# Patient Record
Sex: Male | Born: 1995 | Race: White | Hispanic: No | Marital: Single | Smoking: Never smoker
Health system: Southern US, Community
[De-identification: ages and names within clinical notes are randomized; demographics above are authoritative.]

## PROBLEM LIST (undated history)

## (undated) HISTORY — PX: APPENDECTOMY: SHX54

---
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
## 2019-05-08 ENCOUNTER — Other Ambulatory Visit: Payer: Self-pay

## 2019-05-08 ENCOUNTER — Encounter (HOSPITAL_COMMUNITY): Payer: Self-pay | Admitting: *Deleted

## 2019-05-08 ENCOUNTER — Emergency Department (HOSPITAL_COMMUNITY)
Admission: EM | Admit: 2019-05-08 | Discharge: 2019-05-08 | Disposition: A | Discharge status: Home / Self Care | Payer: Commercial Managed Care - PPO | Attending: Emergency Medicine | Admitting: Emergency Medicine

## 2019-05-08 ENCOUNTER — Emergency Department (HOSPITAL_COMMUNITY): Payer: Commercial Managed Care - PPO

## 2019-05-08 DIAGNOSIS — R1030 Lower abdominal pain, unspecified: Secondary | ICD-10-CM | POA: Diagnosis present

## 2019-05-08 DIAGNOSIS — Z20822 Contact with and (suspected) exposure to covid-19: Secondary | ICD-10-CM | POA: Diagnosis not present

## 2019-05-08 DIAGNOSIS — K529 Noninfective gastroenteritis and colitis, unspecified: Secondary | ICD-10-CM | POA: Insufficient documentation

## 2019-05-08 LAB — URINALYSIS, ROUTINE W REFLEX MICROSCOPIC
Bilirubin Urine: NEGATIVE
Glucose, UA: NEGATIVE mg/dL
Hgb urine dipstick: NEGATIVE
Ketones, ur: NEGATIVE mg/dL
Leukocytes,Ua: NEGATIVE
Nitrite: NEGATIVE
Protein, ur: NEGATIVE mg/dL
Specific Gravity, Urine: 1.024 (ref 1.005–1.030)
pH: 6 (ref 5.0–8.0)

## 2019-05-08 LAB — COMPREHENSIVE METABOLIC PANEL
ALT: 41 U/L (ref 0–44)
AST: 33 U/L (ref 15–41)
Albumin: 4.9 g/dL (ref 3.5–5.0)
Alkaline Phosphatase: 59 U/L (ref 38–126)
Anion gap: 11 (ref 5–15)
BUN: 16 mg/dL (ref 6–20)
CO2: 27 mmol/L (ref 22–32)
Calcium: 9.7 mg/dL (ref 8.9–10.3)
Chloride: 100 mmol/L (ref 98–111)
Creatinine, Ser: 1.05 mg/dL (ref 0.61–1.24)
GFR calc Af Amer: >60 mL/min (ref >60)
GFR calc non Af Amer: >60 mL/min (ref >60)
Glucose, Bld: 103 mg/dL — ABNORMAL HIGH (ref 70–99)
Potassium: 3.9 mmol/L (ref 3.5–5.1)
Sodium: 138 mmol/L (ref 135–145)
Total Bilirubin: 1 mg/dL (ref 0.3–1.2)
Total Protein: 7.8 g/dL (ref 6.5–8.1)

## 2019-05-08 LAB — CBC WITH DIFFERENTIAL/PLATELET
Abs Immature Granulocytes: 0.03 10*3/uL (ref 0.00–0.07)
Basophils Absolute: 0 10*3/uL (ref 0.0–0.1)
Basophils Relative: 0 %
Eosinophils Absolute: 0 10*3/uL (ref 0.0–0.5)
Eosinophils Relative: 0 %
HCT: 43.9 % (ref 39.0–52.0)
Hemoglobin: 14.4 g/dL (ref 13.0–17.0)
Immature Granulocytes: 0 %
Lymphocytes Relative: 16 %
Lymphs Abs: 1.6 10*3/uL (ref 0.7–4.0)
MCH: 30.3 pg (ref 26.0–34.0)
MCHC: 32.8 g/dL (ref 30.0–36.0)
MCV: 92.4 fL (ref 80.0–100.0)
Monocytes Absolute: 0.6 10*3/uL (ref 0.1–1.0)
Monocytes Relative: 6 %
Neutro Abs: 7.9 10*3/uL — ABNORMAL HIGH (ref 1.7–7.7)
Neutrophils Relative %: 78 %
Platelets: 218 10*3/uL (ref 150–400)
RBC: 4.75 MIL/uL (ref 4.22–5.81)
RDW: 13.2 % (ref 11.5–15.5)
WBC: 10.1 10*3/uL (ref 4.0–10.5)
nRBC: 0 % (ref 0.0–0.2)

## 2019-05-08 LAB — LIPASE, BLOOD: Lipase: 28 U/L (ref 11–51)

## 2019-05-08 MED ORDER — MORPHINE SULFATE (PF) 4 MG/ML IV SOLN
4.0000 mg | Freq: Once | INTRAVENOUS | Status: AC
  Administered 2019-05-08: 12:00:00 4 mg via INTRAVENOUS
  Filled 2019-05-08: qty 1

## 2019-05-08 MED ORDER — METRONIDAZOLE 500 MG PO TABS: 500.0000 mg | ORAL_TABLET | Freq: Two times a day (BID) | ORAL | 0 refills | Status: AC

## 2019-05-08 MED ORDER — CIPROFLOXACIN HCL 500 MG PO TABS: 500.0000 mg | ORAL_TABLET | Freq: Two times a day (BID) | ORAL | 0 refills | Status: AC

## 2019-05-08 MED ORDER — ONDANSETRON HCL 4 MG/2ML IJ SOLN
4.0000 mg | Freq: Once | INTRAMUSCULAR | Status: AC
  Administered 2019-05-08: 12:00:00 4 mg via INTRAVENOUS
  Filled 2019-05-08: qty 2

## 2019-05-08 MED ORDER — IOHEXOL 300 MG/ML  SOLN
100.0000 mL | Freq: Once | INTRAMUSCULAR | Status: AC | PRN
  Administered 2019-05-08: 13:00:00 100 mL via INTRAVENOUS

## 2019-05-08 MED ORDER — SODIUM CHLORIDE 0.9 % IV BOLUS
1000.0000 mL | Freq: Once | INTRAVENOUS | Status: AC
  Administered 2019-05-08: 12:00:00 1000 mL via INTRAVENOUS

## 2019-05-08 NOTE — ED Triage Notes (Signed)
Pt c/o abd pain, nausea, headache for the past 3-4 days, last bowel movement was this am and "small" and liquid.

## 2019-05-08 NOTE — ED Provider Notes (Addendum)
[facility] EMERGENCY DEPARTMENT Provider Note   CSN: [id number] Arrival date & time: 05/08/19  1034     History Chief Complaint  Patient presents with  . Abdominal Pain    [name] is a 24 y.o. male.  The history is provided by the patient. No language interpreter was used.  Abdominal Pain    24 year old male with prior appendectomy presenting for evaluation of abdominal pain.  Patient report for the past 3 to 4 days he has had recurrent pain to his lower abdomen.  Pain is described as a crampy sensation with associated nausea but without vomiting.  He endorsed occasional constipation and occasional loose stools.  Last bowel movement was today.  Able to pass flatus.  Pain is rated a 7 out of 10.  No report of fever chills no chest pain shortness of breath productive cough runny nose sneezing or sore throat.  No dysuria or hematuria.  No blood per rectum no hematochezia or melena.  No recent sick contact with anyone with COVID-19.  He had a virtual visit with [facility] for his symptom initially who recommend patient to eat bland food.  He did try eating bland food and symptom did improve mildly however pain intensifies today prompting this ER visit.  Patient denies any testicular pain or scrotal pain.  History reviewed. No pertinent past medical history.  There are no problems to display for this patient.   Past Surgical History:  Procedure Laterality Date  . APPENDECTOMY         No family history on file.  Social History   Tobacco Use  . Smoking status: Never Smoker  . Smokeless tobacco: Never Used  Substance Use Topics  . Alcohol use: Not Currently  . Drug use: Not Currently    Home Medications Prior to Admission medications   Not on File    Allergies    Patient has no known allergies.  Review of Systems   Review of Systems  Gastrointestinal: Positive for abdominal pain.  All other systems reviewed and are negative.   Physical Exam Updated Vital  Signs BP 119/77   Pulse 76   Temp 98.1 F (36.7 C) (Oral)   Resp 16   Ht 5\' 9"  (1.753 m)   Wt 79.4 kg   SpO2 100%   BMI 25.84 kg/m   Physical Exam Vitals and nursing note reviewed.  Constitutional:      General: He is not in acute distress.    Appearance: He is well-developed.  HENT:     Head: Atraumatic.  Eyes:     Conjunctiva/sclera: Conjunctivae normal.  Cardiovascular:     Rate and Rhythm: Normal rate and regular rhythm.     Heart sounds: Normal heart sounds.  Pulmonary:     Effort: Pulmonary effort is normal.     Breath sounds: Normal breath sounds. No wheezing, rhonchi or rales.  Abdominal:     General: Abdomen is flat. Bowel sounds are normal.     Palpations: Abdomen is soft.     Tenderness: There is abdominal tenderness in the left lower quadrant. There is no right CVA tenderness, left CVA tenderness, guarding or rebound. Negative signs include Vallin's sign and McBurney's sign.  Musculoskeletal:     Cervical back: Neck supple.  Skin:    Findings: No rash.  Neurological:     Mental Status: He is alert.  Psychiatric:        Mood and Affect: Mood normal.     ED Results /  Procedures / Treatments   Labs (all labs ordered are listed, but only abnormal results are displayed) Labs Reviewed  CBC WITH DIFFERENTIAL/PLATELET - Abnormal; Notable for the following components:      Result Value   Neutro Abs 7.9 (*)    All other components within normal limits  COMPREHENSIVE METABOLIC PANEL - Abnormal; Notable for the following components:   Glucose, Bld 103 (*)    All other components within normal limits  SARS CORONAVIRUS 2 (TAT 6-24 HRS)  LIPASE, BLOOD  URINALYSIS, ROUTINE W REFLEX MICROSCOPIC    EKG None  Radiology CT ABDOMEN PELVIS W CONTRAST  Result Date: 05/08/2019 CLINICAL DATA:  Lower abdominal pain. Headache and nausea for the past 3-4 days. History of appendectomy 10 years ago. EXAM: CT ABDOMEN AND PELVIS WITH CONTRAST TECHNIQUE: Multidetector CT  imaging of the abdomen and pelvis was performed using the standard protocol following bolus administration of intravenous contrast. CONTRAST:  OMNIPAQUE IOHEXOL 300 MG/ML  SOLN COMPARISON:  06/23/2008 FINDINGS: Lower chest: Limited visualization of the lower thorax is negative for focal airspace opacity or pleural effusion. Normal heart size.  No pericardial effusion. Hepatobiliary: Normal hepatic contour. There is a minimal amount of focal fatty infiltration adjacent to the fissure for the ligamentum teres. No discrete hepatic lesions. Normal appearance of the gallbladder given degree of distention. No radiopaque gallstones. No intra or extrahepatic biliary ductal dilatation. No ascites. Pancreas: Normal appearance of the pancreas. Spleen: Normal appearance of the spleen. Adrenals/Urinary Tract: There is symmetric enhancement of the bilateral kidneys. Punctate (0.7 cm) hypoattenuating right-sided renal lesion is too small to accurately characterize though favored to represent a renal cyst. No discrete left-sided renal lesions. No urinary stones on this postcontrast examination. No discrete renal lesions. Normal appearance of the bilateral adrenal glands. Normal appearance of the urinary bladder given degree distention. Stomach/Bowel: Mild circumferential wall thickening involving the descending and sigmoid colon, potentially accentuated due to underdistention though conceivably an enteritis could have a similar appearance. Potential wall thickening involving the terminal ileum (coronal image 38, series 5). No evidence of enteric obstruction. Scattered minimal colonic diverticulosis without evidence of superimposed acute diverticulitis. Post appendectomy. No hiatal hernia. No pneumoperitoneum, pneumatosis or portal venous gas. Vascular/Lymphatic: Normal caliber of the abdominal aorta. The major branch vessels of the abdominal aorta appear patent on this non CTA examination. Note is made of a tiny accessory  left renal artery which supplies the inferior pole of the left kidney. Scattered mesenteric lymph nodes clustered within the right lower abdominal quadrant are numerous though individually not enlarged by size criteria with index right lower quadrant mesenteric lymph node measuring 0.5 cm in greatest short axis diameter (image 47, series 2). No bulky retroperitoneal, mesenteric, pelvic or inguinal lymphadenopathy. Reproductive: Normal appearance the prostate gland. No free fluid the pelvic cul-de-sac. Other: Tiny mesenteric fat containing periumbilical hernia. Musculoskeletal: No acute or aggressive osseous abnormalities. IMPRESSION: 1. Circumferential wall thickening involving the descending and sigmoid colon with potential wall thickening involving the terminal ileum. While potentially artifactual due to underdistention, an enteritis of inflammatory (such as Crohn's colitis) or infectious etiology could have a similar appearance. Clinical correlation is advised. 2. Otherwise, no explanation for patient's lower abdominal pain. 3. Post appendectomy. Electronically Signed   By: Simonne Come M.D.   On: 05/08/2019 13:35    Procedures Procedures (including critical care time)  Medications Ordered in ED Medications  morphine 4 MG/ML injection 4 mg (4 mg Intravenous Given 05/08/19 1153)  ondansetron (ZOFRAN) injection 4  mg (4 mg Intravenous Given 05/08/19 1153)  sodium chloride 0.9 % bolus 1,000 mL (0 mLs Intravenous Stopped 05/08/19 1318)  iohexol (OMNIPAQUE) 300 MG/ML solution 100 mL (100 mLs Intravenous Contrast Given 05/08/19 1313)    ED Course  I have reviewed the triage vital signs and the nursing notes.  Pertinent labs & imaging results that were available during my care of the patient were reviewed by me and considered in my medical decision making (see chart for details).    MDM Rules/Calculators/A&P                      BP 119/77   Pulse 76   Temp 98.1 F (36.7 C) (Oral)   Resp 16   Ht 5'  9" (1.753 m)   Wt 79.4 kg   SpO2 100%   BMI 25.84 kg/m   Final Clinical Impression(s) / ED Diagnoses Final diagnoses:  Colitis    Rx / DC Orders ED Discharge Orders         Ordered    ciprofloxacin (CIPRO) 500 MG tablet  2 times daily     05/08/19 1403    metroNIDAZOLE (FLAGYL) 500 MG tablet  2 times daily     05/08/19 1403         11:22 AM Patient here with lower abdominal pain and associate nausea with change in bowel movement for the past few days.  He has had prior appendectomy.  On exam he does have some tenderness to his left lower quadrant without guarding or rebound tenderness.  Vital signs stable no hypoxia afebrile.  Will provide symptomatic treatment, will check labs.  A COVID-19 test also obtained.  12:54 PM Labs are reassuring, no leukocytosis, no electrolyte derangement, normal lipase, normal UA.  Patient currently receiving IV fluid.  Patient however still reports concerned about his abdominal discomfort.  Since considering patient has had prior abdominal surgery and after discussion, will obtain abdominal pelvis CT scan to rule out SBO or other acute abdominal pathology however my suspicion is low.  2:00 PM Abdominal pelvis CT scan demonstrate a circumferential wall thickening involving the descending and sigmoid colon.  While this could be artifactual, and enteritis of inflammation such as Crohn's colitis or infectious etiology could have similar appearance.  Given patient's location of pain to the left lower quadrant and this specific finding, I will treat as colitis with Cipro and Flagyl however I will also refer patient to GI for outpatient follow-up as this could be Crohn's disease or ulcerative colitis.  Return precaution discussed.  Patient voiced understanding and agrees with plan.   Phillip Bray was evaluated in Emergency Department on 05/08/2019 for the symptoms described in the history of present illness. He was evaluated in the context of the global  COVID-19 pandemic, which necessitated consideration that the patient might be at risk for infection with the SARS-CoV-2 virus that causes COVID-19. Institutional protocols and algorithms that pertain to the evaluation of patients at risk for COVID-19 are in a state of rapid change based on information released by regulatory bodies including the CDC and federal and state organizations. These policies and algorithms were followed during the patient's care in the ED.     Fayrene Helper, PA-C 05/08/19 1405    Pricilla Loveless, MD 05/09/19 [phone]

## 2019-05-08 NOTE — Discharge Instructions (Signed)
Your abdominal discomfort may be due to colitis which is inflammations of your colon.  This could be due to a virus but sometimes it could also be due to a bacterial infection.  Take antibiotics as prescribed but avoid any alcohol use as it will make you sick.  If your symptoms are reoccurring please consider follow-up with GI specialist for further evaluation.  Sometimes your symptom may be due to an autoimmune condition such as ulcerative colitis or Crohn's disease.  Return if you have any concern.

## 2019-05-09 LAB — SARS CORONAVIRUS 2 (TAT 6-24 HRS): SARS Coronavirus 2: NEGATIVE

## 2019-05-10 ENCOUNTER — Other Ambulatory Visit: Payer: Self-pay

## 2019-05-10 ENCOUNTER — Emergency Department (HOSPITAL_COMMUNITY)
Admission: EM | Admit: 2019-05-10 | Discharge: 2019-05-10 | Disposition: A | Discharge status: Home / Self Care | Payer: Commercial Managed Care - PPO | Attending: Emergency Medicine | Admitting: Emergency Medicine

## 2019-05-10 ENCOUNTER — Encounter (HOSPITAL_COMMUNITY): Payer: Self-pay | Admitting: Emergency Medicine

## 2019-05-10 DIAGNOSIS — R1030 Lower abdominal pain, unspecified: Secondary | ICD-10-CM | POA: Diagnosis not present

## 2019-05-10 LAB — CBC WITH DIFFERENTIAL/PLATELET
Abs Immature Granulocytes: 0.03 10*3/uL (ref 0.00–0.07)
Basophils Absolute: 0 10*3/uL (ref 0.0–0.1)
Basophils Relative: 0 %
Eosinophils Absolute: 0 10*3/uL (ref 0.0–0.5)
Eosinophils Relative: 0 %
HCT: 42.1 % (ref 39.0–52.0)
Hemoglobin: 13.7 g/dL (ref 13.0–17.0)
Immature Granulocytes: 0 %
Lymphocytes Relative: 17 %
Lymphs Abs: 1.5 10*3/uL (ref 0.7–4.0)
MCH: 30.3 pg (ref 26.0–34.0)
MCHC: 32.5 g/dL (ref 30.0–36.0)
MCV: 93.1 fL (ref 80.0–100.0)
Monocytes Absolute: 0.6 10*3/uL (ref 0.1–1.0)
Monocytes Relative: 6 %
Neutro Abs: 6.7 10*3/uL (ref 1.7–7.7)
Neutrophils Relative %: 77 %
Platelets: 222 10*3/uL (ref 150–400)
RBC: 4.52 MIL/uL (ref 4.22–5.81)
RDW: 13.2 % (ref 11.5–15.5)
WBC: 8.8 10*3/uL (ref 4.0–10.5)
nRBC: 0 % (ref 0.0–0.2)

## 2019-05-10 LAB — COMPREHENSIVE METABOLIC PANEL
ALT: 38 U/L (ref 0–44)
AST: 30 U/L (ref 15–41)
Albumin: 4.5 g/dL (ref 3.5–5.0)
Alkaline Phosphatase: 54 U/L (ref 38–126)
Anion gap: 13 (ref 5–15)
BUN: 13 mg/dL (ref 6–20)
CO2: 27 mmol/L (ref 22–32)
Calcium: 9.5 mg/dL (ref 8.9–10.3)
Chloride: 101 mmol/L (ref 98–111)
Creatinine, Ser: 1.21 mg/dL (ref 0.61–1.24)
GFR calc Af Amer: >60 mL/min (ref >60)
GFR calc non Af Amer: >60 mL/min (ref >60)
Glucose, Bld: 105 mg/dL — ABNORMAL HIGH (ref 70–99)
Potassium: 3.7 mmol/L (ref 3.5–5.1)
Sodium: 141 mmol/L (ref 135–145)
Total Bilirubin: 0.9 mg/dL (ref 0.3–1.2)
Total Protein: 7.1 g/dL (ref 6.5–8.1)

## 2019-05-10 LAB — URINALYSIS, ROUTINE W REFLEX MICROSCOPIC
Bilirubin Urine: NEGATIVE
Glucose, UA: NEGATIVE mg/dL
Hgb urine dipstick: NEGATIVE
Ketones, ur: 20 mg/dL — AB
Nitrite: NEGATIVE
Protein, ur: NEGATIVE mg/dL
Specific Gravity, Urine: 1.021 (ref 1.005–1.030)
pH: 6 (ref 5.0–8.0)

## 2019-05-10 LAB — LIPASE, BLOOD: Lipase: 26 U/L (ref 11–51)

## 2019-05-10 MED ORDER — ONDANSETRON HCL 4 MG/2ML IJ SOLN
4.0000 mg | Freq: Once | INTRAMUSCULAR | Status: AC
  Administered 2019-05-10: 4 mg via INTRAVENOUS
  Filled 2019-05-10: qty 2

## 2019-05-10 MED ORDER — PANTOPRAZOLE SODIUM 20 MG PO TBEC: 20.0000 mg | DELAYED_RELEASE_TABLET | Freq: Every day | ORAL | 0 refills | Status: AC

## 2019-05-10 MED ORDER — SODIUM CHLORIDE 0.9 % IV BOLUS
1000.0000 mL | Freq: Once | INTRAVENOUS | Status: AC
  Administered 2019-05-10: 1000 mL via INTRAVENOUS

## 2019-05-10 MED ORDER — ONDANSETRON 4 MG PO TBDP: 4.0000 mg | ORAL_TABLET | Freq: Three times a day (TID) | ORAL | 0 refills | Status: AC | PRN

## 2019-05-10 NOTE — ED Provider Notes (Signed)
[facility] EMERGENCY DEPARTMENT Provider Note   CSN: [id number] Arrival date & time: 05/10/19  9562     History Chief Complaint  Patient presents with  . Abdominal Pain    Phillip Bray is a 24 y.o. male.  24yo male with no significant past medical history presents with complaint of lower abdominal pain, nausea, lack of appetite. Patient reports onset of symptoms last Tuesday, had a telehealth visit and was told he may have a stomach virus, tried a bland diet. Patient states he felt better Friday and then had return of symptoms with worsening lower abdominal pain on Saturday which prompted visit to [facility] ER where he had labs and a CT scan, was thought to have colitis and was dc with Cipro and Flagyl which he states he has been taking without any improvement. Pain is described as a hunger pain, does not radiate, nothing makes pain better or worse, is constant but severity waxes and wanes. Reports nausea with dry heaves, states he tries to take a bite of food but has no appetite and is unable to really eat anything which he attributes to his lack of stool output. Denies changes in bladder habits or appearance of urine, denies fevers, chills, sweats. Prior abdominal surgeries include appendectomy, has never had symptoms like this before. No other complaints or concerns.         History reviewed. No pertinent past medical history.  There are no problems to display for this patient.   Past Surgical History:  Procedure Laterality Date  . APPENDECTOMY         No family history on file.  Social History   Tobacco Use  . Smoking status: Never Smoker  . Smokeless tobacco: Never Used  Substance Use Topics  . Alcohol use: Not Currently  . Drug use: Not Currently    Home Medications Prior to Admission medications   Medication Sig Start Date End Date Taking? Authorizing Provider  ciprofloxacin (CIPRO) 500 MG tablet Take 1 tablet (500 mg total) by mouth 2  (two) times daily. One po bid x 7 days Patient taking differently: Take 500 mg by mouth 2 (two) times daily. for 7 days 05/08/19  Yes Domenic Moras, PA-C  metroNIDAZOLE (FLAGYL) 500 MG tablet Take 1 tablet (500 mg total) by mouth 2 (two) times daily. One po bid x 7 days Patient taking differently: Take 500 mg by mouth 2 (two) times daily. for 7 days 05/08/19  Yes Domenic Moras, PA-C  ondansetron (ZOFRAN ODT) 4 MG disintegrating tablet Take 1 tablet (4 mg total) by mouth every 8 (eight) hours as needed for nausea or vomiting. 05/10/19   Tacy Learn, PA-C  pantoprazole (PROTONIX) 20 MG tablet Take 1 tablet (20 mg total) by mouth daily. 05/10/19 06/09/19  Tacy Learn, PA-C    Allergies    Patient has no known allergies.  Review of Systems   Review of Systems  Constitutional: Negative for chills, diaphoresis and fever.  Respiratory: Negative for shortness of breath.   Cardiovascular: Negative for chest pain.  Gastrointestinal: Positive for abdominal pain and nausea. Negative for blood in stool, constipation, diarrhea and vomiting.  Genitourinary: Negative for decreased urine volume, difficulty urinating, dysuria, frequency and urgency.  Musculoskeletal: Negative for arthralgias and myalgias.  Skin: Negative for rash and wound.  Allergic/Immunologic: Negative for immunocompromised state.  Neurological: Negative for weakness.  Hematological: Negative for adenopathy.  Psychiatric/Behavioral: Negative for confusion.  All other systems reviewed and are negative.  Physical Exam Updated Vital Signs BP (!) 111/91   Pulse (!) 51   Temp 98.4 F (36.9 C) (Oral)   Resp 16   Ht 5\' 9"  (1.753 m)   Wt 79.4 kg   SpO2 98%   BMI 25.84 kg/m   Physical Exam Vitals and nursing note reviewed.  Constitutional:      General: He is not in acute distress.    Appearance: He is well-developed. He is not diaphoretic.  HENT:     Head: Normocephalic and atraumatic.  Cardiovascular:     Rate and Rhythm:  Normal rate and regular rhythm.     Heart sounds: Normal heart sounds.  Pulmonary:     Effort: Pulmonary effort is normal.  Abdominal:     Palpations: Abdomen is soft.     Tenderness: There is abdominal tenderness in the right lower quadrant, suprapubic area and left lower quadrant. There is no right CVA tenderness or left CVA tenderness.     Hernia: No hernia is present.  Skin:    General: Skin is warm and dry.     Findings: No rash.  Neurological:     Mental Status: He is alert and oriented to person, place, and time.  Psychiatric:        Behavior: Behavior normal.     ED Results / Procedures / Treatments   Labs (all labs ordered are listed, but only abnormal results are displayed) Labs Reviewed  COMPREHENSIVE METABOLIC PANEL - Abnormal; Notable for the following components:      Result Value   Glucose, Bld 105 (*)    All other components within normal limits  URINALYSIS, ROUTINE W REFLEX MICROSCOPIC - Abnormal; Notable for the following components:   Ketones, ur 20 (*)    Leukocytes,Ua SMALL (*)    Bacteria, UA RARE (*)    All other components within normal limits  URINE CULTURE  CBC WITH DIFFERENTIAL/PLATELET  LIPASE, BLOOD    EKG None  Radiology CT ABDOMEN PELVIS W CONTRAST  Result Date: 05/08/2019 CLINICAL DATA:  Lower abdominal pain. Headache and nausea for the past 3-4 days. History of appendectomy 10 years ago. EXAM: CT ABDOMEN AND PELVIS WITH CONTRAST TECHNIQUE: Multidetector CT imaging of the abdomen and pelvis was performed using the standard protocol following bolus administration of intravenous contrast. CONTRAST:  05/10/2019 OMNIPAQUE IOHEXOL 300 MG/ML  SOLN COMPARISON:  06/23/2008 FINDINGS: Lower chest: Limited visualization of the lower thorax is negative for focal airspace opacity or pleural effusion. Normal heart size.  No pericardial effusion. Hepatobiliary: Normal hepatic contour. There is a minimal amount of focal fatty infiltration adjacent to the fissure  for the ligamentum teres. No discrete hepatic lesions. Normal appearance of the gallbladder given degree of distention. No radiopaque gallstones. No intra or extrahepatic biliary ductal dilatation. No ascites. Pancreas: Normal appearance of the pancreas. Spleen: Normal appearance of the spleen. Adrenals/Urinary Tract: There is symmetric enhancement of the bilateral kidneys. Punctate (0.7 cm) hypoattenuating right-sided renal lesion is too small to accurately characterize though favored to represent a renal cyst. No discrete left-sided renal lesions. No urinary stones on this postcontrast examination. No discrete renal lesions. Normal appearance of the bilateral adrenal glands. Normal appearance of the urinary bladder given degree distention. Stomach/Bowel: Mild circumferential wall thickening involving the descending and sigmoid colon, potentially accentuated due to underdistention though conceivably an enteritis could have a similar appearance. Potential wall thickening involving the terminal ileum (coronal image 38, series 5). No evidence of enteric obstruction. Scattered minimal colonic diverticulosis  without evidence of superimposed acute diverticulitis. Post appendectomy. No hiatal hernia. No pneumoperitoneum, pneumatosis or portal venous gas. Vascular/Lymphatic: Normal caliber of the abdominal aorta. The major branch vessels of the abdominal aorta appear patent on this non CTA examination. Note is made of a tiny accessory left renal artery which supplies the inferior pole of the left kidney. Scattered mesenteric lymph nodes clustered within the right lower abdominal quadrant are numerous though individually not enlarged by size criteria with index right lower quadrant mesenteric lymph node measuring 0.5 cm in greatest short axis diameter (image 47, series 2). No bulky retroperitoneal, mesenteric, pelvic or inguinal lymphadenopathy. Reproductive: Normal appearance the prostate gland. No free fluid the pelvic  cul-de-sac. Other: Tiny mesenteric fat containing periumbilical hernia. Musculoskeletal: No acute or aggressive osseous abnormalities. IMPRESSION: 1. Circumferential wall thickening involving the descending and sigmoid colon with potential wall thickening involving the terminal ileum. While potentially artifactual due to underdistention, an enteritis of inflammatory (such as Crohn's colitis) or infectious etiology could have a similar appearance. Clinical correlation is advised. 2. Otherwise, no explanation for patient's lower abdominal pain. 3. Post appendectomy. Electronically Signed   By: Simonne Come M.D.   On: 05/08/2019 13:35    Procedures Procedures (including critical care time)  Medications Ordered in ED Medications  ondansetron (ZOFRAN) injection 4 mg (4 mg Intravenous Given 05/10/19 0748)  sodium chloride 0.9 % bolus 1,000 mL (0 mLs Intravenous Stopped 05/10/19 5638)    ED Course  I have reviewed the triage vital signs and the nursing notes.  Pertinent labs & imaging results that were available during my care of the patient were reviewed by me and considered in my medical decision making (see chart for details).  Clinical Course as of May 09 1128  Mon May 10, 2019  2791 24 year old male with report of nausea, lower abdominal pain, lack of appetite.  Review of prior ER evaluation including labs and CT scan.  On exam patient has mild tenderness lower abdominal area is otherwise well-appearing.  Labs today including CBC, CMP, urinalysis and lipase without significant change from prior labs.  Do not suspect patient needs repeat CT abdomen pelvis at this time.  Will add on urine culture due to small leukocytes with rare bacteria, no urinary symptoms, patient is currently taking Cipro.  Patient has contacted GI while waiting in the ER and would like to follow-up with [facility].  Given prescription for Zofran and Protonix and recommend follow-up with GI, return to ER for worsening pain or fevers.     [LM]    Clinical Course User Index [LM] Alden Hipp   MDM Rules/Calculators/A&P                      Final Clinical Impression(s) / ED Diagnoses Final diagnoses:  Lower abdominal pain    Rx / DC Orders ED Discharge Orders         Ordered    ondansetron (ZOFRAN ODT) 4 MG disintegrating tablet  Every 8 hours PRN     05/10/19 0944    pantoprazole (PROTONIX) 20 MG tablet  Daily     05/10/19 1009           Jayzon, Taras 05/10/19 1130    Rolan Bucco, MD 05/10/19 1235

## 2019-05-10 NOTE — Discharge Instructions (Addendum)
Contact GI today as previously referred for follow up. New referral for local [facility] provided. Take Zofran as prescribed as needed for nausea. This may help with appetite. Return to ER for fever or worsening pain.

## 2019-05-10 NOTE — ED Triage Notes (Signed)
Pt arrives to ER with c/c of lower abd pain since last Tuesday was seen in ER this past Saturday 2/13 and diagnosed with colitis per pt. Pt states "I have no appetite and the pain has not changed". Pt reports 6/10 lower abd cramping. Pt denies any v/d.

## 2019-05-11 LAB — URINE CULTURE: Culture: NO GROWTH

## 2019-05-13 ENCOUNTER — Ambulatory Visit: Payer: Commercial Managed Care - PPO | Admitting: Nurse Practitioner

## 2021-01-25 IMAGING — CT CT ABD-PELV W/ CM
2 of 4 series · 15 of 46 positions shown, 17 images · IV contrast (omnipaque)
Comparison: 06/23/2008

CLINICAL DATA: Lower abdominal pain. Headache and nausea for the
past 3-4 days. History of appendectomy 10 years ago.

EXAM:
CT ABDOMEN AND PELVIS WITH CONTRAST
TECHNIQUE: Multidetector CT imaging of the abdomen and pelvis was performed
using the standard protocol following bolus administration of
intravenous contrast.
CONTRAST:  100mL OMNIPAQUE IOHEXOL 300 MG/ML  SOLN

[Series 2: axial st · axial · 0.80mm/px · z∈[+790,+1225]mm · 12 of 103 slices shown, 14 images]
[im 8/103  soft-tissue]
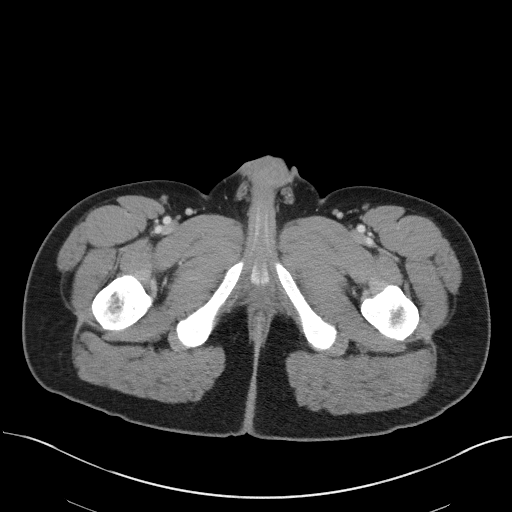
[im 8/103  bone]
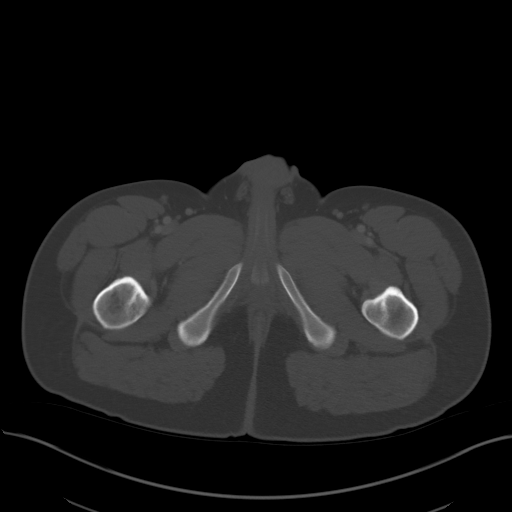
[im 15/103  soft-tissue]
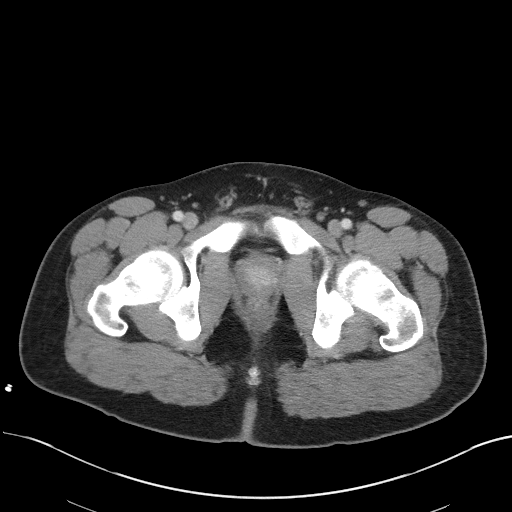
[im 22/103  soft-tissue]
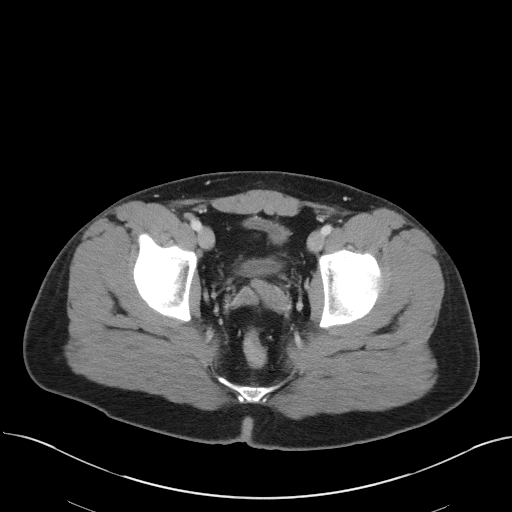
[im 30/103  soft-tissue]
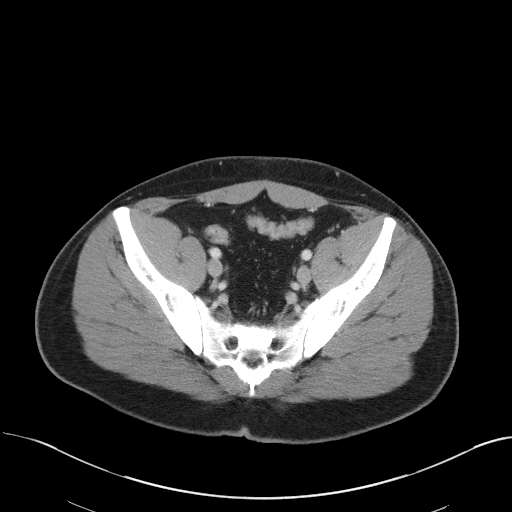
[im 37/103  soft-tissue]
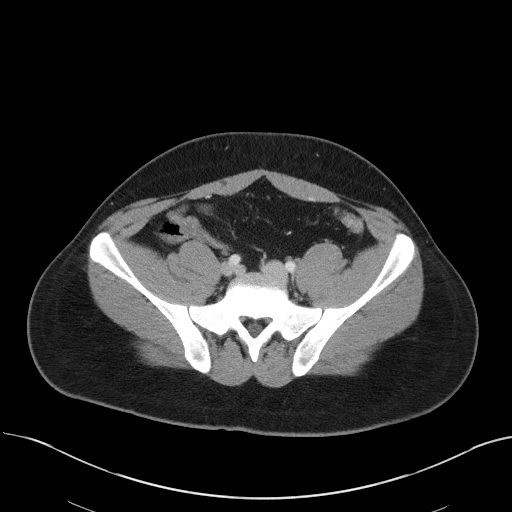
[im 44/103  soft-tissue]
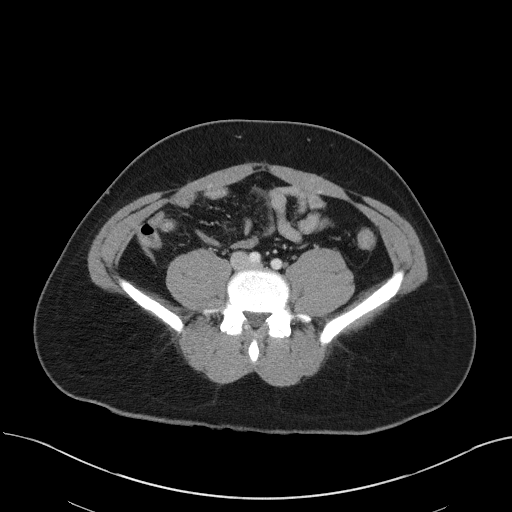
[im 59/103  soft-tissue]
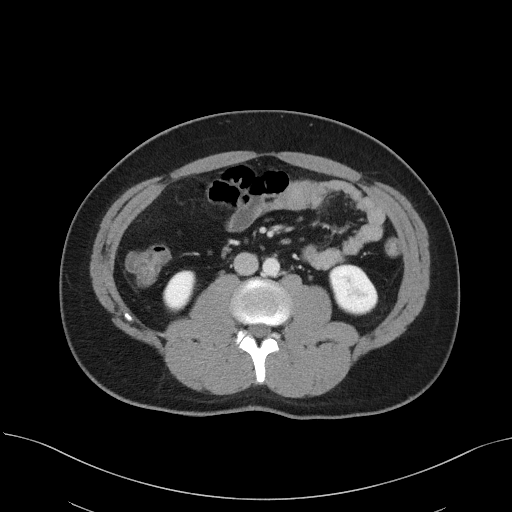
[im 66/103  soft-tissue]
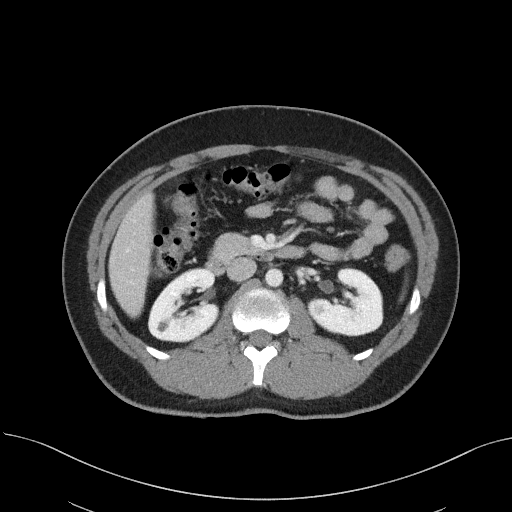
[im 73/103  soft-tissue]
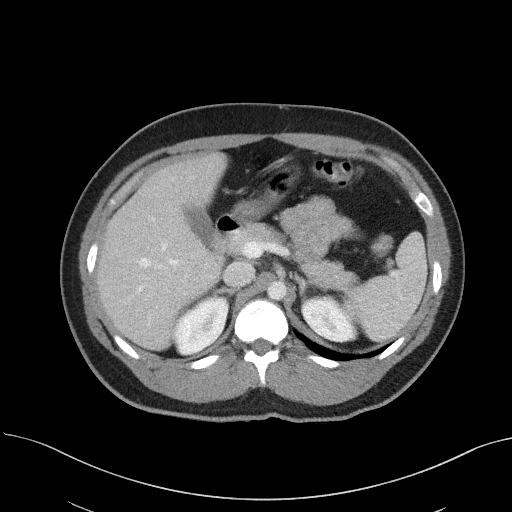
[im 73/103  bone]
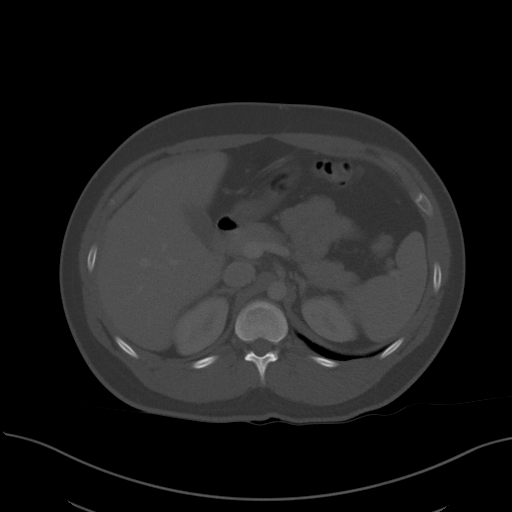
[im 81/103  soft-tissue]
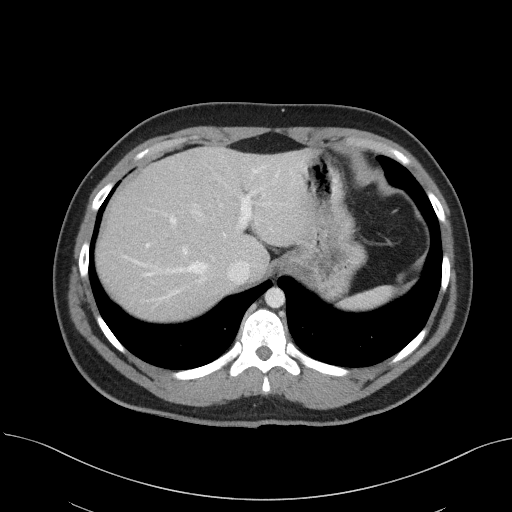
[im 88/103  soft-tissue]
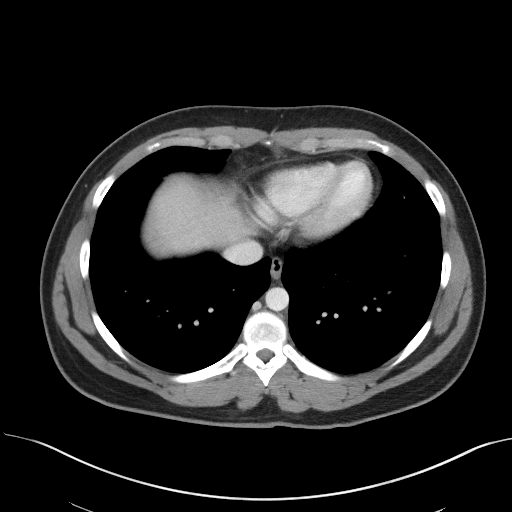
[im 95/103  soft-tissue]
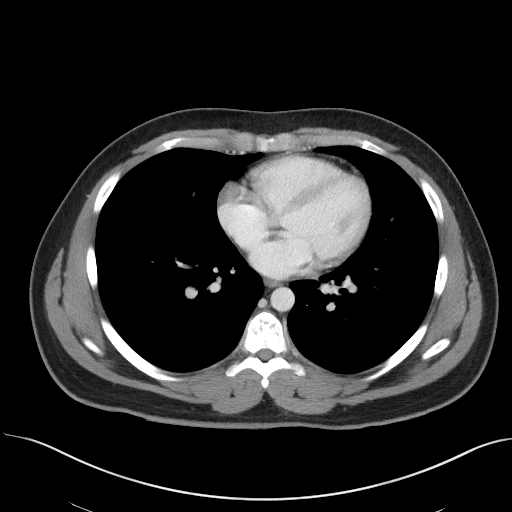

[Series 5: coronal st · coronal · 0.78mm/px · 3 of 96 slices shown]
[im 32/96  soft-tissue]
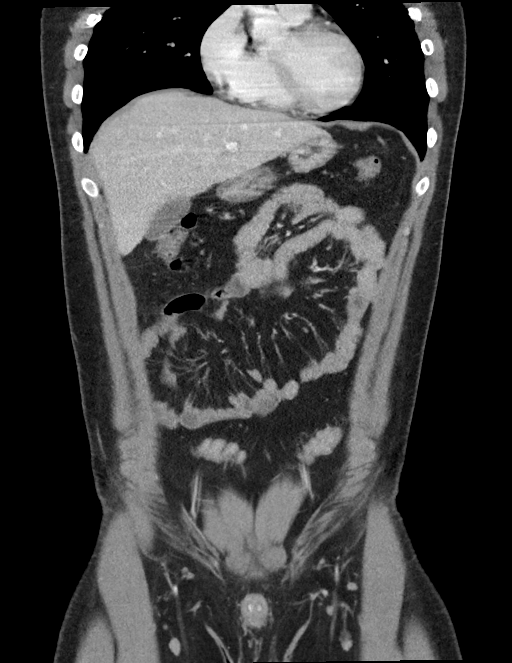
[im 43/96  soft-tissue]
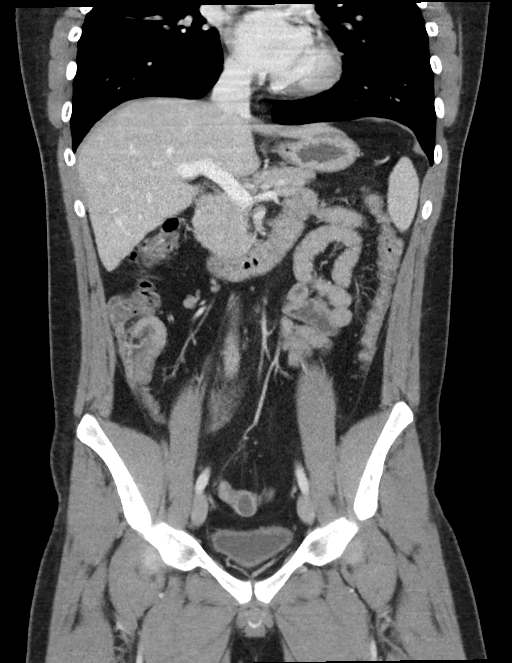
[im 53/96  soft-tissue]
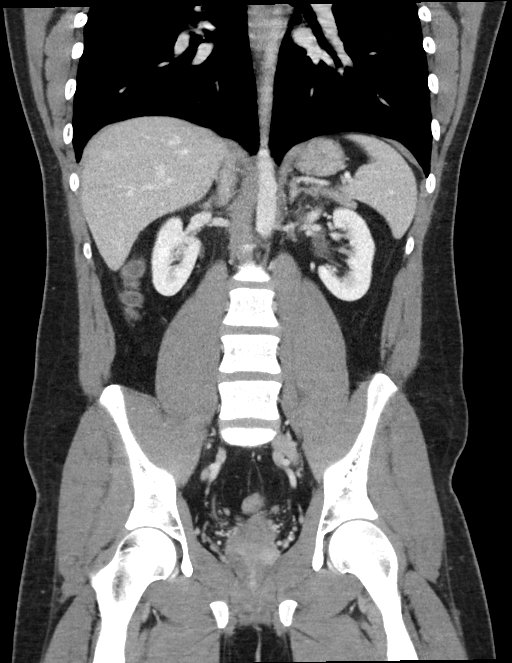

[15 of 46 positions shown; findings below may reference images not displayed]

FINDINGS: Lower chest: Limited visualization of the lower thorax is negative
for focal airspace opacity or pleural effusion.

Normal heart size.  No pericardial effusion.

Hepatobiliary: Normal hepatic contour. There is a minimal amount of
focal fatty infiltration adjacent to the fissure for the ligamentum
teres. No discrete hepatic lesions. Normal appearance of the
gallbladder given degree of distention. No radiopaque gallstones. No
intra or extrahepatic biliary ductal dilatation. No ascites.

Pancreas: Normal appearance of the pancreas.

Spleen: Normal appearance of the spleen.

Adrenals/Urinary Tract: There is symmetric enhancement of the
bilateral kidneys. Punctate (0.7 cm) hypoattenuating right-sided
renal lesion is too small to accurately characterize though favored
to represent a renal cyst. No discrete left-sided renal lesions. No
urinary stones on this postcontrast examination. No discrete renal
lesions.

Normal appearance of the bilateral adrenal glands. Normal appearance
of the urinary bladder given degree distention.

Stomach/Bowel: Mild circumferential wall thickening involving the
descending and sigmoid colon, potentially accentuated due to
underdistention though conceivably an enteritis could have a similar
appearance. Potential wall thickening involving the terminal ileum
(coronal image 38, series 5). No evidence of enteric obstruction.

Scattered minimal colonic diverticulosis without evidence of
superimposed acute diverticulitis.

Post appendectomy. No hiatal hernia. No pneumoperitoneum,
pneumatosis or portal venous gas.

Vascular/Lymphatic: Normal caliber of the abdominal aorta. The major
branch vessels of the abdominal aorta appear patent on this non CTA
examination. Note is made of a tiny accessory left renal artery
which supplies the inferior pole of the left kidney.

Scattered mesenteric lymph nodes clustered within the right lower
abdominal quadrant are numerous though individually not enlarged by
size criteria with index right lower quadrant mesenteric lymph node
measuring 0.5 cm in greatest short axis diameter (image 47, series
2). No bulky retroperitoneal, mesenteric, pelvic or inguinal
lymphadenopathy.

Reproductive: Normal appearance the prostate gland. No free fluid
the pelvic cul-de-sac.

Other: Tiny mesenteric fat containing periumbilical hernia.

Musculoskeletal: No acute or aggressive osseous abnormalities.
IMPRESSION: 1. Circumferential wall thickening involving the descending and
sigmoid colon with potential wall thickening involving the terminal
ileum. While potentially artifactual due to underdistention, an
enteritis of inflammatory (such as Crohn's colitis) or infectious
etiology could have a similar appearance. Clinical correlation is
advised.
2. Otherwise, no explanation for patient's lower abdominal pain.
3. Post appendectomy.

## 2021-08-25 ENCOUNTER — Other Ambulatory Visit (HOSPITAL_COMMUNITY): Payer: Self-pay | Admitting: Gastroenterology

## 2021-08-25 ENCOUNTER — Other Ambulatory Visit: Payer: Self-pay | Admitting: Gastroenterology

## 2021-08-25 DIAGNOSIS — R1011 Right upper quadrant pain: Secondary | ICD-10-CM

## 2021-09-03 ENCOUNTER — Ambulatory Visit (HOSPITAL_COMMUNITY)
Admission: RE | Admit: 2021-09-03 | Discharge: 2021-09-03 | Disposition: A | Discharge status: Home / Self Care | Payer: Commercial Managed Care - PPO | Source: Ambulatory Visit | Attending: Gastroenterology | Admitting: Gastroenterology

## 2021-09-03 DIAGNOSIS — R1011 Right upper quadrant pain: Secondary | ICD-10-CM | POA: Diagnosis present

## 2021-09-07 ENCOUNTER — Encounter (HOSPITAL_COMMUNITY): Payer: Commercial Managed Care - PPO
